# Patient Record
Sex: Female | Born: 1970 | Race: Asian | Hispanic: No | Marital: Married | Smoking: Never smoker
Health system: Southern US, Community
[De-identification: ages and names within clinical notes are randomized; demographics above are authoritative.]

## PROBLEM LIST (undated history)

## (undated) DIAGNOSIS — E079 Disorder of thyroid, unspecified: Secondary | ICD-10-CM

## (undated) DIAGNOSIS — E119 Type 2 diabetes mellitus without complications: Secondary | ICD-10-CM

## (undated) DIAGNOSIS — E785 Hyperlipidemia, unspecified: Secondary | ICD-10-CM

## (undated) HISTORY — PX: THYROIDECTOMY, PARTIAL: SHX18

---

## 2005-02-10 ENCOUNTER — Ambulatory Visit: Payer: Self-pay | Admitting: *Deleted

## 2005-02-12 ENCOUNTER — Ambulatory Visit: Payer: Self-pay | Admitting: *Deleted

## 2005-02-17 ENCOUNTER — Ambulatory Visit: Payer: Self-pay | Admitting: Obstetrics & Gynecology

## 2005-02-17 ENCOUNTER — Ambulatory Visit (HOSPITAL_COMMUNITY): Admission: RE | Admit: 2005-02-17 | Discharge: 2005-02-17 | Payer: Self-pay | Admitting: Obstetrics & Gynecology

## 2005-02-19 ENCOUNTER — Ambulatory Visit: Payer: Self-pay | Admitting: *Deleted

## 2005-02-24 ENCOUNTER — Ambulatory Visit: Payer: Self-pay | Admitting: *Deleted

## 2005-02-26 ENCOUNTER — Ambulatory Visit: Payer: Self-pay | Admitting: *Deleted

## 2005-03-03 ENCOUNTER — Ambulatory Visit: Payer: Self-pay | Admitting: Obstetrics & Gynecology

## 2005-03-05 ENCOUNTER — Ambulatory Visit: Payer: Self-pay | Admitting: *Deleted

## 2005-03-06 ENCOUNTER — Ambulatory Visit: Payer: Self-pay | Admitting: Family Medicine

## 2005-03-06 ENCOUNTER — Inpatient Hospital Stay (HOSPITAL_COMMUNITY): Admission: AD | Admit: 2005-03-06 | Discharge: 2005-03-10 | Payer: Self-pay | Admitting: *Deleted

## 2005-03-07 ENCOUNTER — Encounter (INDEPENDENT_AMBULATORY_CARE_PROVIDER_SITE_OTHER): Payer: Self-pay | Admitting: Specialist

## 2005-04-15 ENCOUNTER — Ambulatory Visit: Payer: Self-pay | Admitting: Family Medicine

## 2006-08-29 IMAGING — US US FETAL BPP W/O NONSTRESS
1 series · 13 of 28 positions shown · non-contrast
Comparison: none

CLINICAL DATA: Patient does not know her LMP and estimates that it is 06/21/04 which would give a gestational age of 34 weeks 3 days.  An outside ultrasound performed on 02/02/05 gives a due date of 03/06/05 which would result in a gestational age today of 37 weeks 4 days.

[Series 1: us fetal bpp w/o nonstress · 0.37mm/px · 71 acquisitions, 13 frames shown]
[im 3/71]
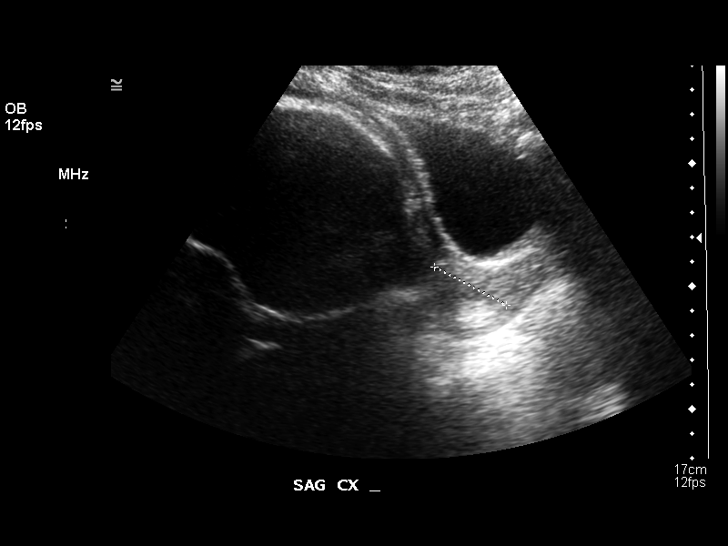
[im 8/71]
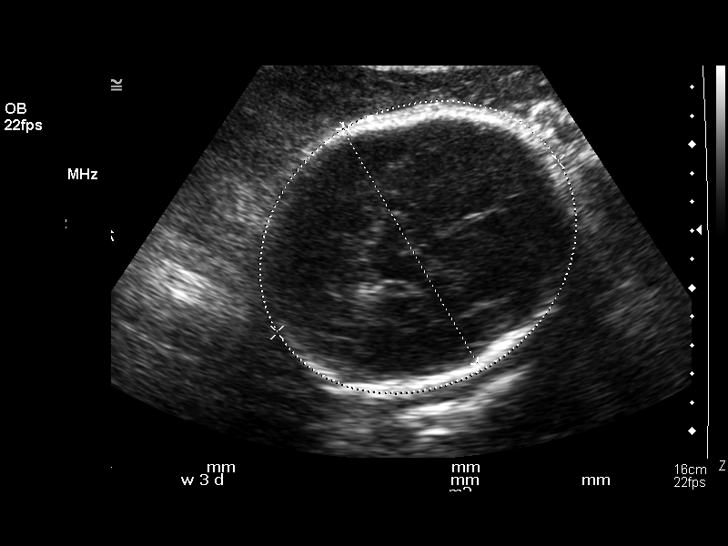
[im 13/71]
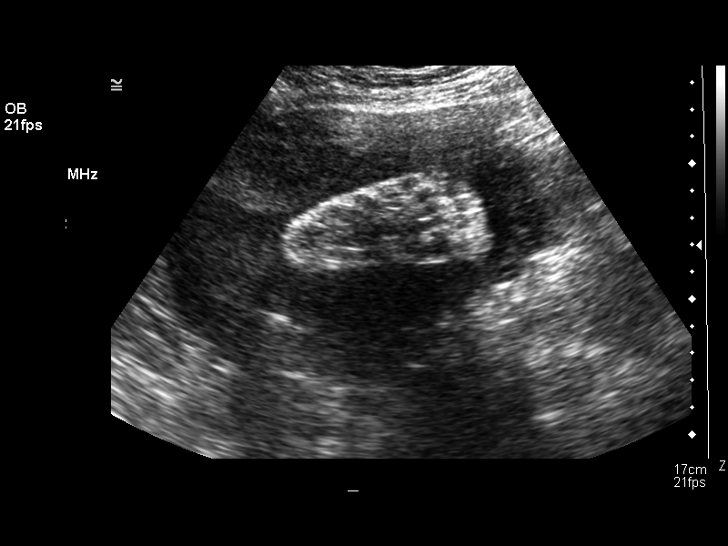
[im 19/71]
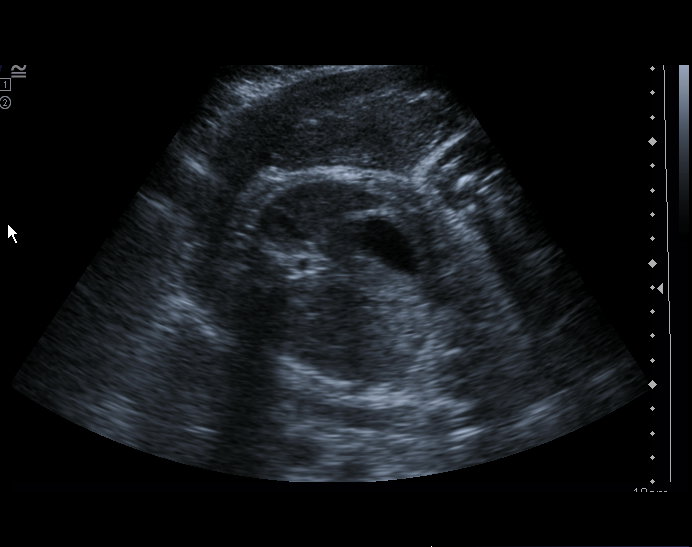
[im 24/71]
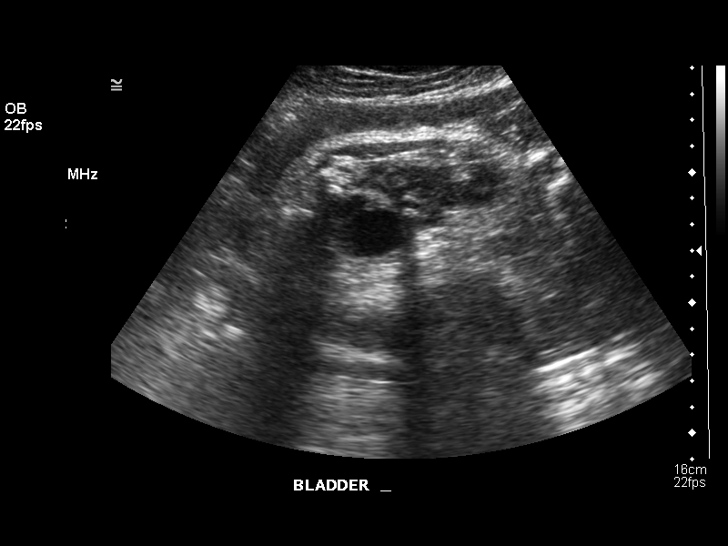
[im 29/71]
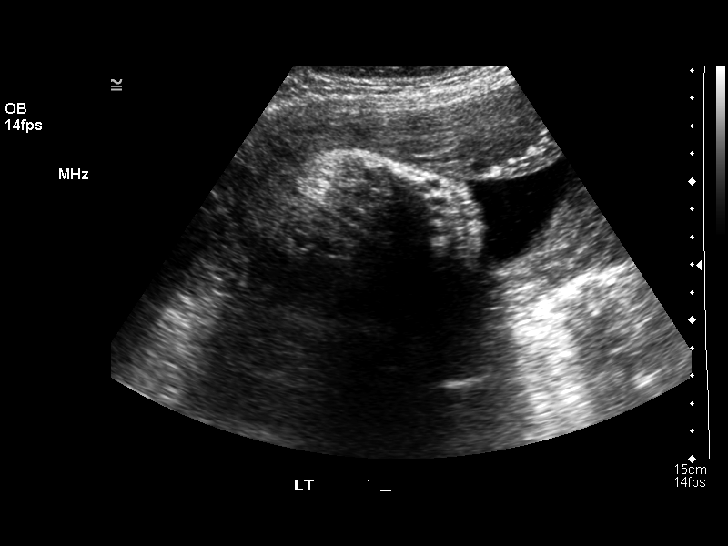
[im 37/71]
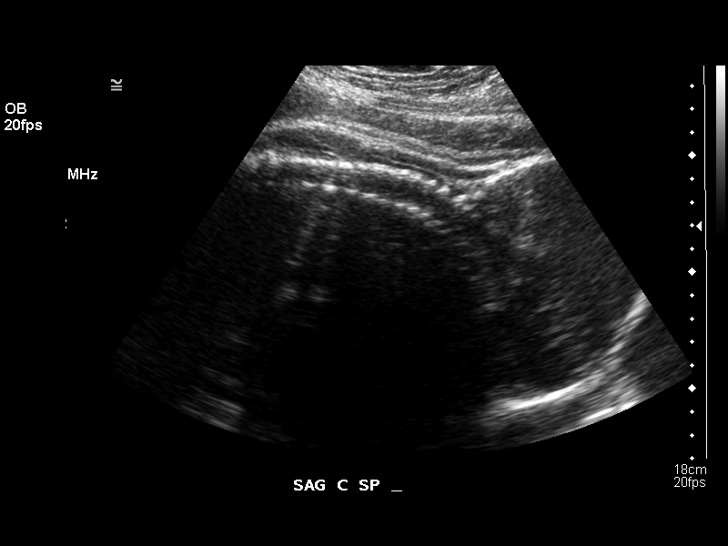
[im 42/71]
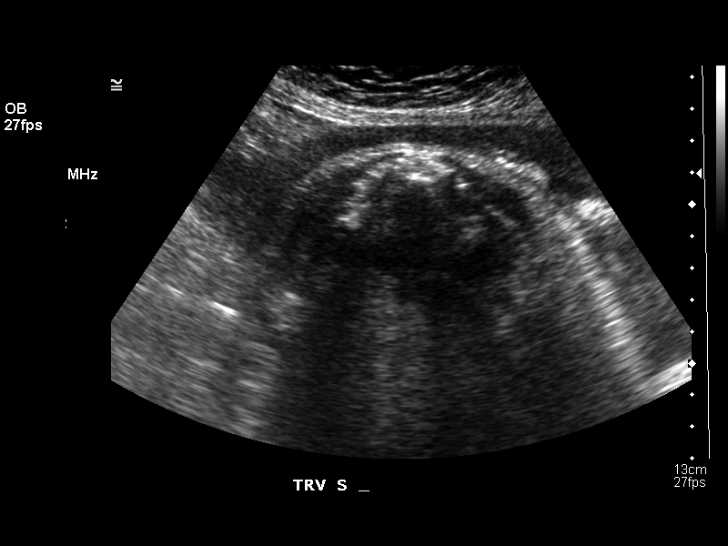
[im 47/71]
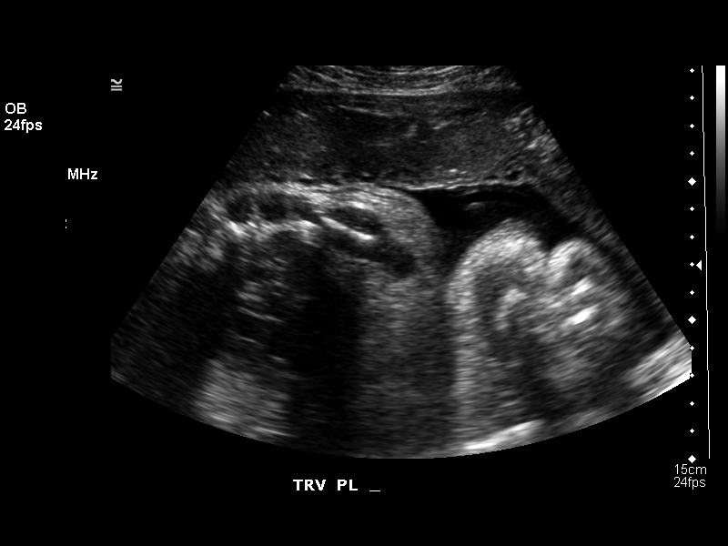
[im 52/71]
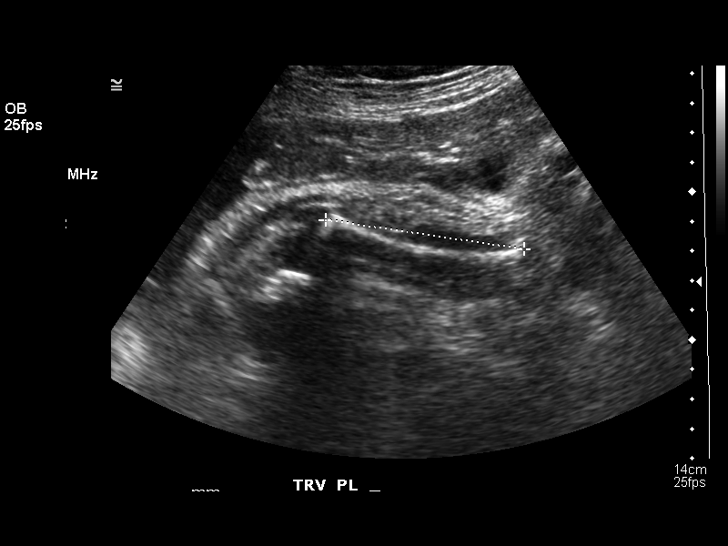
[im 58/71]
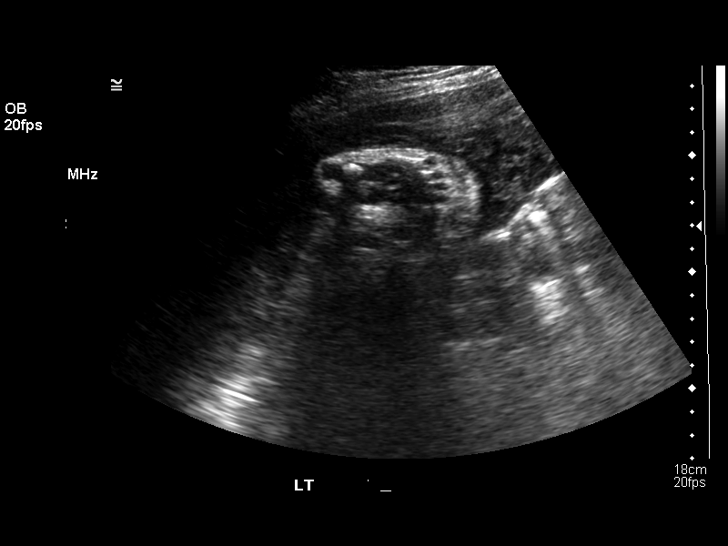
[im 63/71]
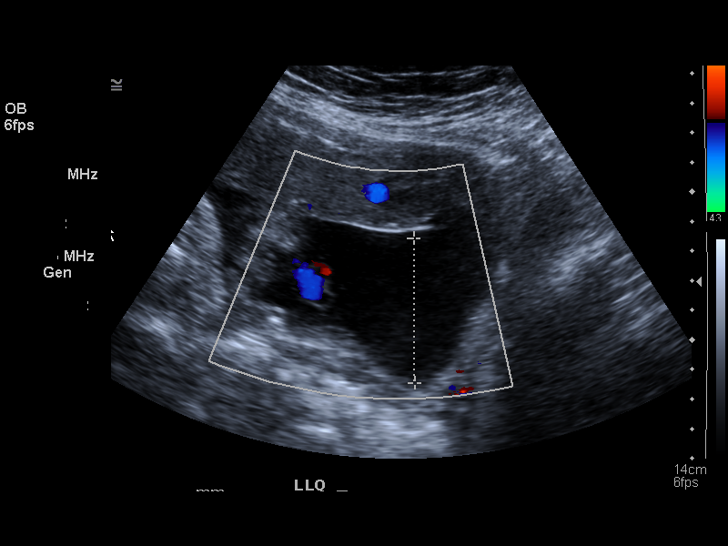
[im 68/71]
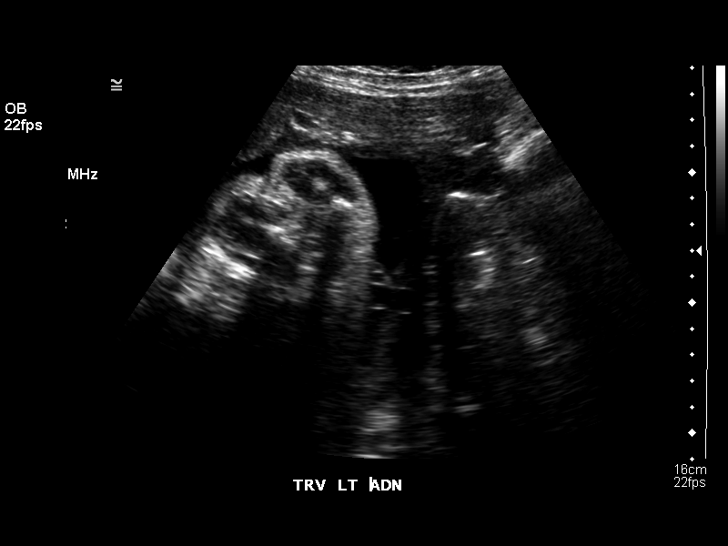

[13 of 28 positions shown; findings below may reference images not displayed]

OBSTETRICAL ULTRASOUND:
Number of Fetuses:  1
Heart Rate:  131
Movement:  Yes
Breathing:  Yes  
Presentation:  Cephalic
Placental Location:  Anterior
Grade:  III
Previa:  No
Amniotic Fluid (Subjective):  Normal
Amniotic Fluid (Objective):  15.1 cm AFI (5th -95th%ile = 7.3 ? 23.9 cm for 38 wks)

FETAL BIOMETRY
BPD:  9.4 cm   38 w 1 d
HC:  33.1 cm  37 w 5 d
AC:  31.8 cm   35 w 5 d
FL:  6.8 cm   34 w 5 d
HL  5.9 cm   34 w 3 d
MEAN GA:  36 w 1 d
?LMP GA:                     34 w 3 d
1st US GA:  37 w 4 d 
EFW:  9181 g (H) 10th ? 25th%ile (0122 ? 8933 g) for 38 wks; 90th ? 95th%Iile (0088 ? 5711 g) if patient is 34 wks

FETAL ANATOMY
Lateral Ventricles:  Visualized 
Thalami/CSP:      Visualized 
Posterior Fossa:  Not visualized 
Nuchal Region:  N/A
Spine:        Visualized except sacral spine
4 Chamber Heart on Left:      Visualized  
Stomach on Left:      Visualized 
3 Vessel Cord:  Visualized 
Cord Insertion site:  Not visualized 
Kidneys:  Visualized 
Bladder:  Visualized 
Extremities:      Left extremities visualized, right extremities not visualized.  

ADDITIONAL ANATOMY VISUALIZED:  LVOT, diaphragm, aortic arch, and male genitalia.

Evaluation limited by:  Maternal habitus, fetal position and advanced gestational age 

MATERNAL UTERINE AND ADNEXAL FINDINGS
Cervix:  3.3 cm Transabdominally
IMPRESSION: 1.  Single living intrauterine fetus in cephalic presentation.  Patient measures 36 weeks 1 day today with a fetal weight of 9181 grams.  This would fall between the 10th and 25th percentile if the patient is felt to be 38 weeks and between the 90th and 95th percentile if the patient is felt to be 34 weeks.
2.  Amniotic fluid volume is normal with AFI 15.1 cm.  
3.  Limited anatomic survey due to late gestational age, maternal habitus and fetal positioning. 

BIOPHYSICAL PROFILE

Movement:  2    Time:  30 minutes
Breathing:  2
Tone:  2
Amniotic Fluid:  2

Total Score:  8
IMPRESSION: Biophysical profile score is [DATE] in 30 minutes of scanning.  

ADDENDUM:
Dr. Santhe Roy that they are using 38 weeks estimated gestational age for dates for this patient.  Given this consideration the femur length would be just above the 5th percentile for a 38 week gestation and the humeral length would be right at the 5th percentile for a 38 week gestation.  This suggests that this is related to physiologic limb shortening rather than a stigmata of rheizomelic shortening.

## 2009-02-20 ENCOUNTER — Ambulatory Visit (HOSPITAL_COMMUNITY): Admission: RE | Admit: 2009-02-20 | Discharge: 2009-02-20 | Payer: Self-pay | Admitting: Family Medicine

## 2010-07-12 ENCOUNTER — Encounter: Payer: Self-pay | Admitting: Obstetrics & Gynecology

## 2010-09-01 IMAGING — US US SOFT TISSUE HEAD/NECK
1 series · 13 of 25 positions shown · non-contrast
Comparison: None

CLINICAL DATA: History given of palpable mass in the right lobe of
the thyroid.

THYROID ULTRASOUND
TECHNIQUE: Ultrasound examination of the thyroid gland and
adjacent soft tissues was performed.

[Series 1: us soft tissue head/neck · 0.11mm/px · 13 of 28 slices shown]
[im 1/28]
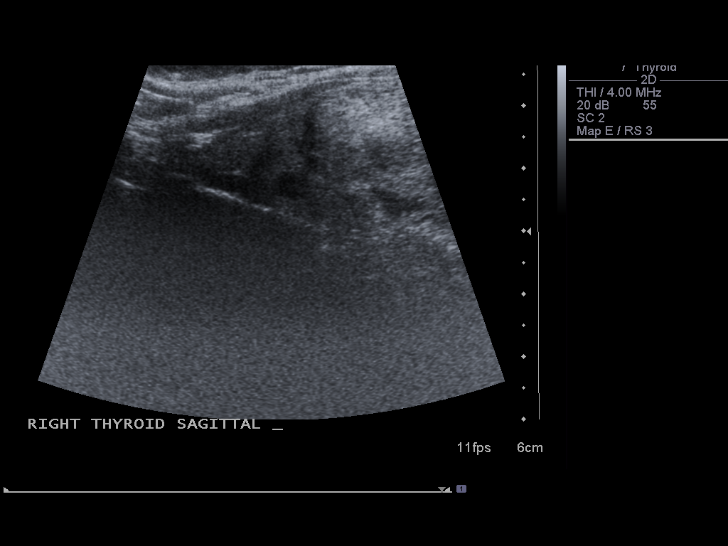
[im 3/28]
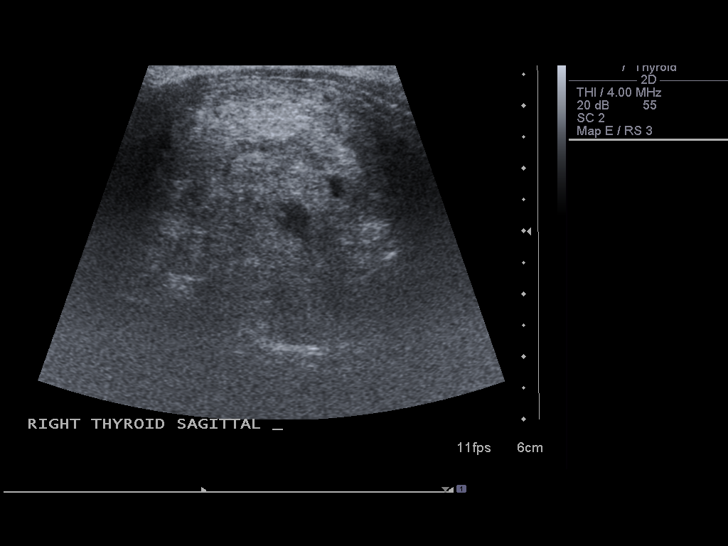
[im 5/28]
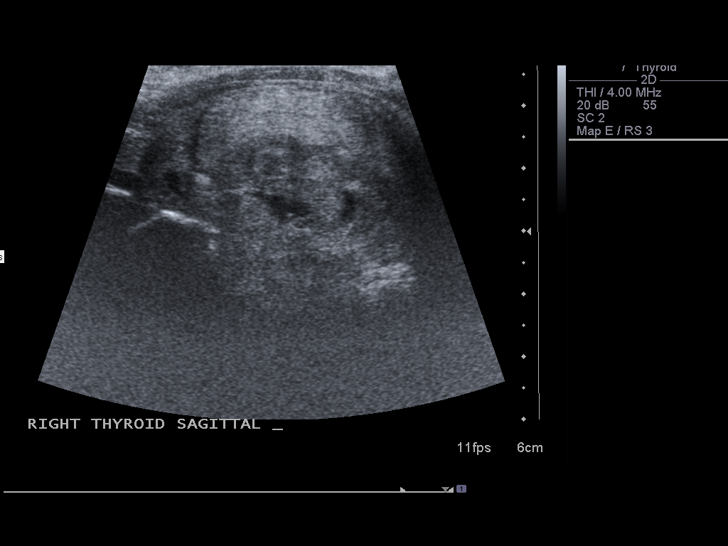
[im 7/28]
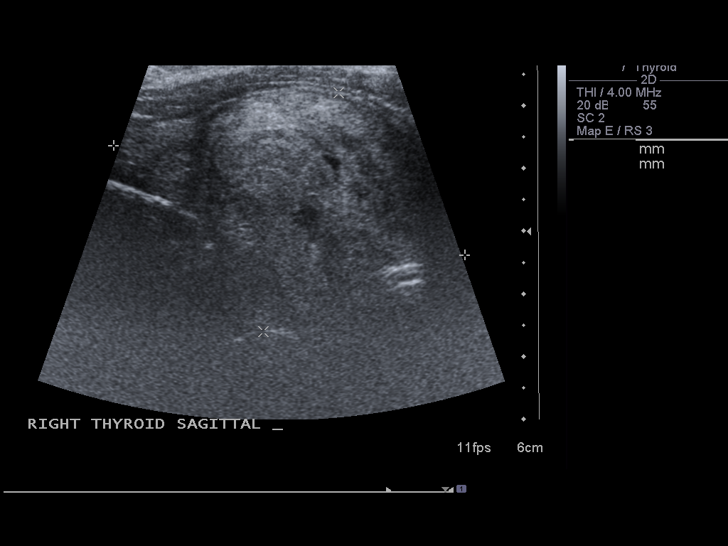
[im 10/28]
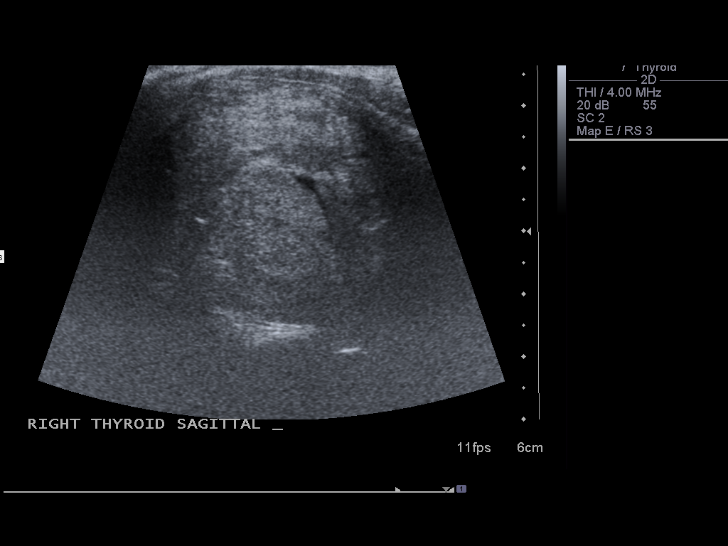
[im 12/28]
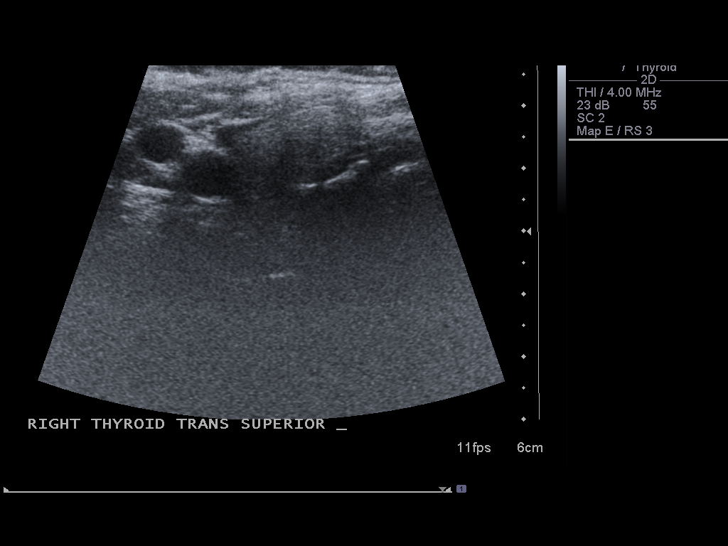
[im 14/28]
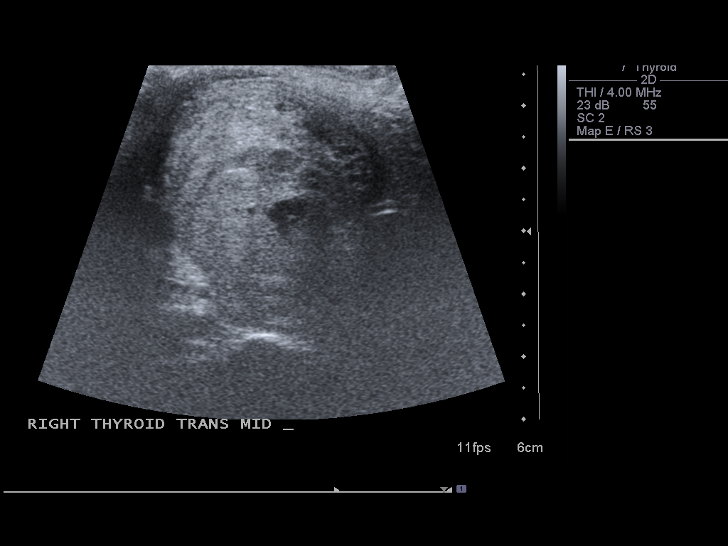
[im 16/28]
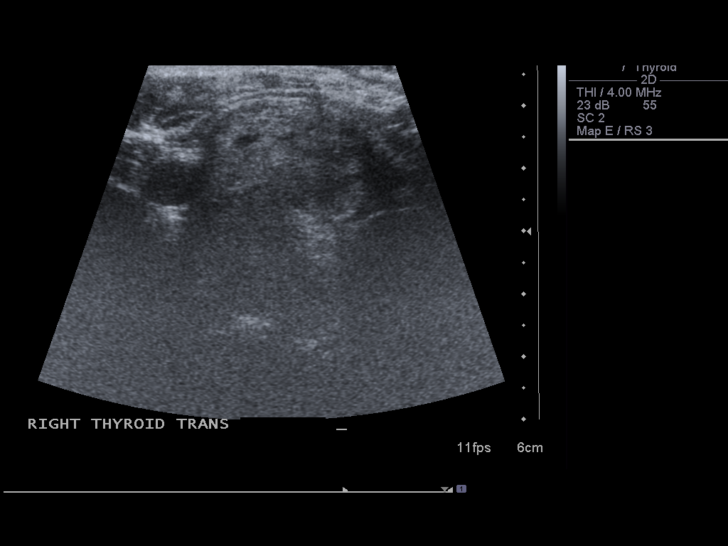
[im 19/28]
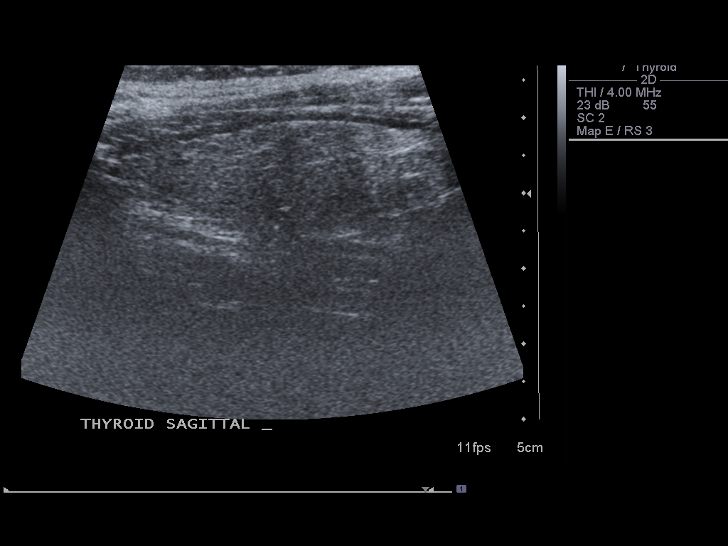
[im 21/28]
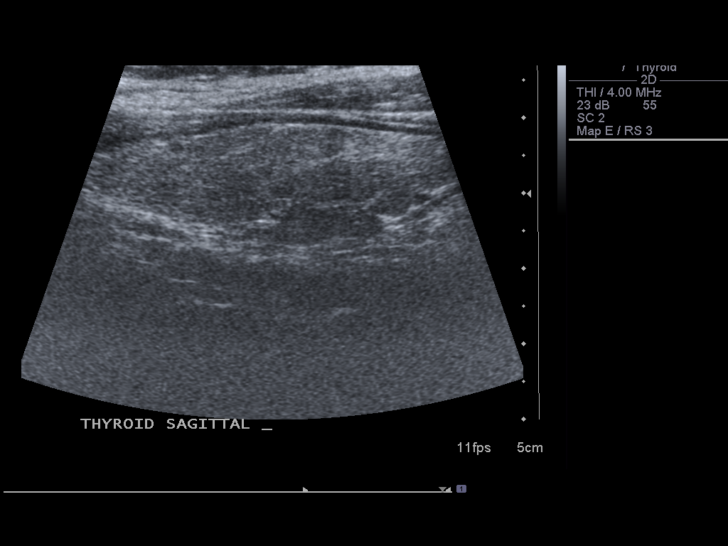
[im 23/28]
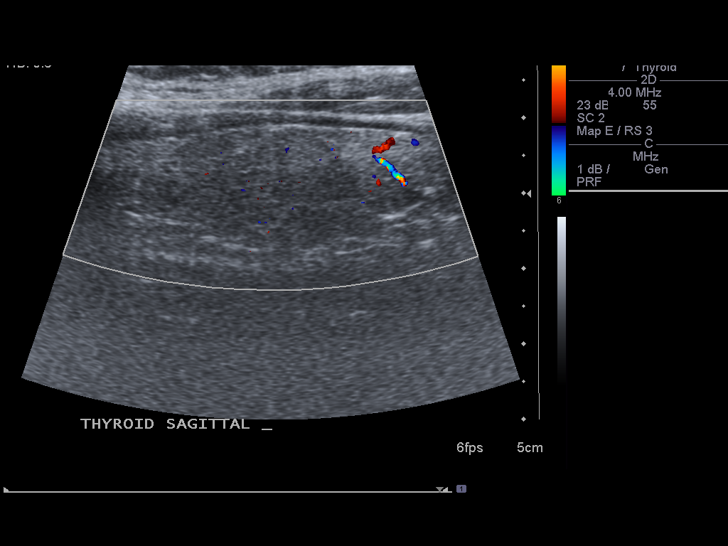
[im 25/28]
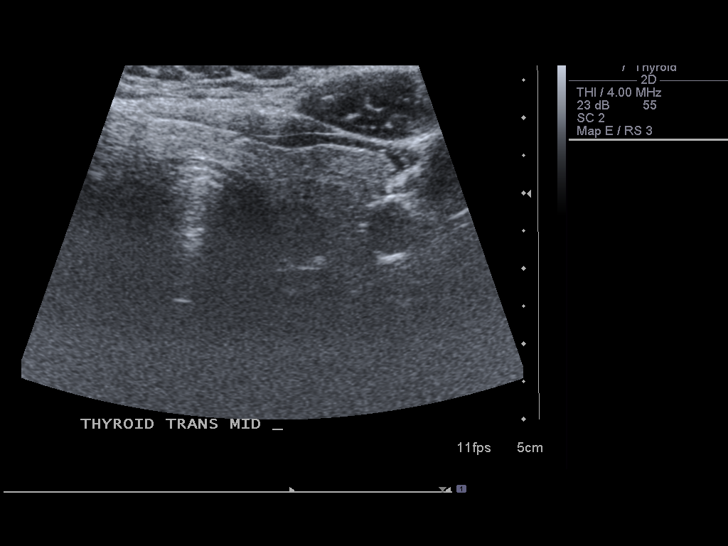
[im 28/28]
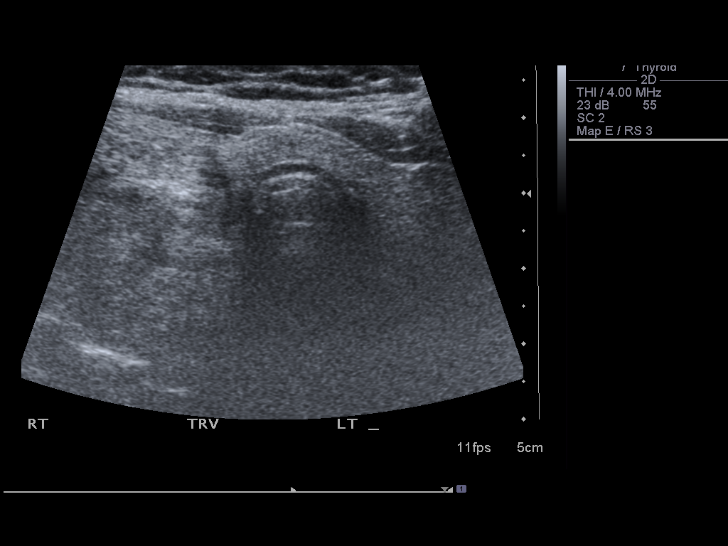

[13 of 25 positions shown; findings below may reference images not displayed]

FINDINGS: The right lobe of the thyroid measures 5.9 x 5.0 x
cm.

The left lobe of the thyroid measures 5.0 x 1.6 x 1.6 cm in
diameter.

The AP diameter of the isthmus is 0.6 cm.

Thyroid parenchymal texture is slightly heterogeneous.  No mass is
evident within the left lobe of the thyroid where the isthmus.

A discrete area of solid heterogeneous nodularity is seen involving
the majority of the right lobe of the thyroid.  This mass measures
3.9 x 3.8 x 4.0 cm in diameter.  No calcifications are seen
associated with it.  There is some internal color Doppler flow.
IMPRESSION: There is mild thyromegaly with the right lobe of the thyroid gland
measuring larger than the left with a solid heterogeneous mass
involving the majority of the right lobe of the thyroid.  It has
some internal color Doppler flow. This could have benign or
malignant etiology.  Recommend ultrasound-guided fine needle
aspiration biopsy of the mass.

This recommendation follows the consensus statement:  "Management
of Thyroid Nodules Detected as US:  Society of Radiologists in
Ultrasound Consensus Conference Statement".  Radiology 1999;
[DATE].  Available online at :
[URL]

## 2010-11-06 NOTE — Discharge Summary (Signed)
NAMEREISHA, WOS           ACCOUNT NO.:  [id number]   MEDICAL RECORD NO.:  [id number]          PATIENT TYPE:  INP   LOCATION:                                [facility]ACILITY:  [facility]   PHYSICIAN:  Tanya S. Shawnie Pons, M.D.   DATE OF BIRTH:  12/21/1970   DATE OF ADMISSION:  03/05/2005  DATE OF DISCHARGE:  03/10/2005                                 DISCHARGE SUMMARY   ADMISSION DIAGNOSES:  83.  A 40 year old G1, P0-0-0 at 40 weeks for induction of labor.  2.  Gestational diabetes A2.  3.  Fetal heart rate reassuring.   DISCHARGE DIAGNOSES:  33.  A 40 year old G1, P1-0-0-1 POD #3 low transverse cesarean section      secondary to failure to progress/descend.  2.  Female, Apgars 9 at the first minute and 9 at the fifth minute.   DISCHARGE MEDICATIONS:  1.  Percocet 625/5 one tablet p.o. q.4-6h. p.r.n. pain.  2.  Ibuprofen 600 mg one tablet p.o. q.6h. p.r.n. pain.  3.  Prenatal vitamins one tablet p.o. daily.   ADMISSION HISTORY:  A 40 year old G1, P0-0-0 at 40 weeks with gestational  diabetes A2 for induction of labor.   HOSPITAL COURSE:  #72 - A 40 year old G1, P0-0-0 at 40 weeks with gestational  diabetes A2 treated with Glyburide came to the MAU for admission to labor  and delivery for induction of labor.  Patient has had late prenatal care  with first ultrasound at 35 weeks.  Induction was initiated with Cervidil  which caused bradycardia secondary to hyperstimulation, therefore, Cervidil  was removed.  When patient was 4 cm Pitocin was started.  The patient had a  very slow progress even though MVUs were adequate.  After approximately four  hours of adequate uterine contractions the patient went from a cervix of 5-6  cm of dilation, 90% of effacement, and high station to 6-7 cm of dilation,  90%, and high station.  The baby was felt to be not completely OA and not  being in the pelvis.  No significant cervical changes were noticed despite  adequate labor documented for three to four hours,  therefore, a cesarean  section was recommended.  Patient tolerated well the procedure.  Amniotic  fluid was clear.  No nuchal cord.  Apgars were 9 at the first minute and 9  at the fifth minute.  For the rest of the hospitalization patient was  clinically stable.  Cesarean section area on physical examination showed no  signs of infection/inflammation.  Pain is controlled with medications.  Staples were removed before discharge home.   #2 - GESTATIONAL DIABETES A2:  During labor patient was on Glyburide 2.5 mg  one tablet p.o. daily.  After labor Glyburide was discontinued.  CBGs  fasting initially 95, 136 at 10:30 a.m., 131 at 1:30 p.m., and 116 at 8 p.m.  Fasting CBGs on day of discharge 92.  Patient was discharged with no  medication for gestational diabetes.  Issue to be followed up at the six-  week appointment.   LABORATORY DATA:  Group O+, antibody negative.  Rubella immune.  GBS  negative.  Hemoglobin 11.3, hematocrit 33.6 both on March 08, 2005.   CONDITION ON DISCHARGE:  Stable.   INSTRUCTIONS:  Pelvic rest for six weeks.  Nothing per vagina, no sex for  six weeks.  Patient understands follow-up in six weeks at [facility] and understands DC meds.   FOLLOW-UP:  At [facility] in six weeks.      Henri Medal, MD    ______________________________  Shelbie Proctor. Shawnie Pons, M.D.    Lendon Colonel  D:  03/10/2005  T:  03/10/2005  Job:  [id number]

## 2010-11-06 NOTE — Op Note (Signed)
[name], [name]           ACCOUNT NO.:  [id number]   MEDICAL RECORD NO.:  [id number]          PATIENT TYPE:  INP   LOCATION:  9106                          [facility]ILITY:  [facility]   PHYSICIAN:  Tanya S. Shawnie Pons, M.D.   DATE OF BIRTH:  11-21-1970   DATE OF PROCEDURE:  03/07/2005  DATE OF DISCHARGE:                                 OPERATIVE REPORT   PREOPERATIVE DIAGNOSES:  1.  Arrest of dilation.  2.  Gestational diabetes mellitus A2.  3.  Late prenatal care.   POSTOPERATIVE DIAGNOSES:  1.  Arrest of dilation.  2.  Gestational diabetes mellitus A2.  3.  Late prenatal care.   PROCEDURE:  Primary low transverse cesarean section.   SURGEON:  Shelbie Proctor. Shawnie Pons, M.D.   ASSISTANT:  None.   ANESTHESIA:  Epidural and local.   SPECIMENS:  Placenta to pathology.   ESTIMATED BLOOD LOSS:  1000 mL.   COMPLICATIONS:  None.   FINDINGS:  A viable female infant, Apgars 9 and 9, weight 6 pounds 15 ounces,  in an oblique position between OA and OT, with the head not being in the  pelvis.   REASON FOR PROCEDURE:  The patient is a 40 year old G1 who is a gestational  diabetic on glyburide, who was being induced for that reason.  She had very  late prenatal care with first ultrasound at 35 weeks, which changed her due  date significantly.  The patient was brought in and induced with Cervidil.  She had a bradycardia related to the Cervidil, which caused  hyperstimulation, and that was removed.  At that time she was 4 cm, and that  was at approximately 10 a.m.  Pitocin was started approximately 11:30 to 12  o'clock and the patient continued to make very slow progress through the  day.  At 4 o'clock she was 5-6, 90, and high.  At 6 o'clock she was 6-7, 90,  and high as well.  With adequate MVUs documented for well over two hours, at  7:25 she continued to be 6-7, 90, and high station.  In fact, the infant was  not even in the pelvis.  The baby was felt to be not completely OA with the  had not  being in the pelvis and no significant cervical change despite  adequate labor documented for three to four hours, so it was felt that the  patient would best be served by abdominal delivery and she agreed to this  plan.   PROCEDURE:  The patient was taken to the operating room, where she was  placed in the supine position with a left lateral tilt.  She was prepped and  draped in the usual sterile fashion and the anesthesia was tested with an  Allis clamp and was found to be adequate.  A Pfannenstiel incision was made  with the knife.  This incision was carried down to the fascia in the  midline.  Any bleeders were cauterized in the subcutaneous tissue and the  fascia opened laterally.  The rectus was then divided and the peritoneal  cavity entered bluntly.  The bladder blade was then  used and __________ and  the low transverse incision was made on the uterus above the bladder.  This  incision was carried down to the amniotic cavity, where clear fluid was  encountered.  The infant's head was brought up and delivered easily.  The  rest of the body followed.  A spontaneous cry on the abdomen, the cord was  clamped x2 and cut while the infant was bulb-suctioned.  The infant was then  handed off to awaiting pediatrics.  Cord blood was obtained, the placenta  delivered easily.  The uterus cleaned with a dry lap pad.  The edges of the  uterine incision were grasped with ring forceps.  The uterus was closed with  0 Vicryl suture in a locked running fashion.  A second imbricating layer was  then used over this, as the patient might choose to labor in a future  pregnancy.  The abdomen was then irrigated, the uterine incision inspected  and found to be hemostatic with the exception of a few bleeders along the  bladder and inferior edge where the bladder flap was.  These were cauterized  with electrocautery.  When hemostasis was adequate, the fascia was closed in  a 0 Vicryl suture in a running  fashion.  The subcutaneous tissue was  irrigated and bleeders cauterized with electrocautery and the skin incision  clipped.  Marcaine 0.25% 25 mL were then injected for added local  anesthesia.  The patient tolerated the procedure well and all instrument,  needle and lap counts were correct x2.  The patient was awakened and taken  to the recovery room in stable condition.  The infant was taken to the  newborn nursery also in stable condition.           ______________________________  Shelbie Proctor Shawnie Pons, M.D.     TSP/MEDQ  D:  03/07/2005  T:  03/08/2005  Job:  [id number]

## 2010-11-06 NOTE — Group Therapy Note (Signed)
[name], MOOR           ACCOUNT NO.:  [id number]   MEDICAL RECORD NO.:  [id number]          PATIENT TYPE:  WOC   LOCATION:  [facility]                   FACILITY:  [facility]   PHYSICIAN:  Tinnie Gens, MD        DATE OF BIRTH:  Feb 15, 1971   DATE OF SERVICE:                                    CLINIC NOTE   CHIEF COMPLAINT:  Followup after C section.   HISTORY OF PRESENT ILLNESS:  The patient is a 40 year old gravida 1, para 1  who delivered by low transverse cesarean delivery __________  who was an A2  diabetic.  The patient has been home for a while.  She still feels some sort  of tearing and sharp pain sensations in the area of her scar.  She is  currently on no birth control.  She is trying to get pregnant again.  The  patient is no longer nursing.  She reports her mood is good.  Her appetite  is always stable.   PHYSICAL EXAMINATION:  VITAL SIGNS:  Her vital signs are as noted on the  chart.  GENERAL:  She is a well-developed, well-nourished, Asian female in no acute  distress.  ABDOMEN:  Soft, nontender, and nondistended.  Her incision is well healed.  The uterus is involuted and small.   IMPRESSION:  Postpartum check.   PLAN:  Pap smear in August of 2007, and return for prenatal care as needed.           ______________________________  Tinnie Gens, MD     TP/MEDQ  D:  04/15/2005  T:  04/15/2005  Job:  [id number]

## 2010-11-06 NOTE — Discharge Summary (Signed)
[name], [name]           ACCOUNT NO.:  [id number]   MEDICAL RECORD NO.:  [id number]          PATIENT TYPE:  INP   LOCATION:                                [facility]   PHYSICIAN:  Tanya S. Shawnie Pons, M.D.   DATE OF BIRTH:  1970/08/09   DATE OF ADMISSION:  03/05/2005  DATE OF DISCHARGE:  03/10/2005                                 DISCHARGE SUMMARY   ADDENDUM:   CONTRACEPTION:  Patient does not desire contraception method.  Patient  states that she has been seeking pregnancy for approximately 16 years.  Patient desires a new pregnancy, therefore, she denies contraception.      Henri Medal, MD    ______________________________  Shelbie Proctor. Shawnie Pons, M.D.    Lendon Colonel  D:  03/10/2005  T:  03/10/2005  Job:  [id number]

## 2014-01-22 ENCOUNTER — Emergency Department (HOSPITAL_BASED_OUTPATIENT_CLINIC_OR_DEPARTMENT_OTHER): Payer: BC Managed Care – PPO

## 2014-01-22 ENCOUNTER — Emergency Department (HOSPITAL_BASED_OUTPATIENT_CLINIC_OR_DEPARTMENT_OTHER)
Admission: EM | Admit: 2014-01-22 | Discharge: 2014-01-22 | Disposition: A | Discharge status: Home / Self Care | Payer: BC Managed Care – PPO | Attending: Emergency Medicine | Admitting: Emergency Medicine

## 2014-01-22 ENCOUNTER — Encounter (HOSPITAL_BASED_OUTPATIENT_CLINIC_OR_DEPARTMENT_OTHER): Payer: Self-pay | Admitting: Emergency Medicine

## 2014-01-22 DIAGNOSIS — R1011 Right upper quadrant pain: Secondary | ICD-10-CM | POA: Insufficient documentation

## 2014-01-22 DIAGNOSIS — Z90710 Acquired absence of both cervix and uterus: Secondary | ICD-10-CM | POA: Insufficient documentation

## 2014-01-22 DIAGNOSIS — Z3202 Encounter for pregnancy test, result negative: Secondary | ICD-10-CM | POA: Insufficient documentation

## 2014-01-22 DIAGNOSIS — Z79899 Other long term (current) drug therapy: Secondary | ICD-10-CM | POA: Insufficient documentation

## 2014-01-22 DIAGNOSIS — E119 Type 2 diabetes mellitus without complications: Secondary | ICD-10-CM | POA: Insufficient documentation

## 2014-01-22 DIAGNOSIS — K8020 Calculus of gallbladder without cholecystitis without obstruction: Secondary | ICD-10-CM | POA: Insufficient documentation

## 2014-01-22 DIAGNOSIS — Z862 Personal history of diseases of the blood and blood-forming organs and certain disorders involving the immune mechanism: Secondary | ICD-10-CM | POA: Insufficient documentation

## 2014-01-22 DIAGNOSIS — Z8639 Personal history of other endocrine, nutritional and metabolic disease: Secondary | ICD-10-CM | POA: Insufficient documentation

## 2014-01-22 DIAGNOSIS — E785 Hyperlipidemia, unspecified: Secondary | ICD-10-CM | POA: Insufficient documentation

## 2014-01-22 HISTORY — DX: Hyperlipidemia, unspecified: E78.5

## 2014-01-22 HISTORY — DX: Type 2 diabetes mellitus without complications: E11.9

## 2014-01-22 HISTORY — DX: Disorder of thyroid, unspecified: E07.9

## 2014-01-22 LAB — URINALYSIS, ROUTINE W REFLEX MICROSCOPIC
BILIRUBIN URINE: NEGATIVE
Glucose, UA: NEGATIVE mg/dL
HGB URINE DIPSTICK: NEGATIVE
Ketones, ur: NEGATIVE mg/dL
Leukocytes, UA: NEGATIVE
NITRITE: NEGATIVE
PH: 5.5 (ref 5.0–8.0)
PROTEIN: NEGATIVE mg/dL
SPECIFIC GRAVITY, URINE: 1.005 (ref 1.005–1.030)
Urobilinogen, UA: 0.2 mg/dL (ref 0.0–1.0)

## 2014-01-22 LAB — COMPREHENSIVE METABOLIC PANEL
ALBUMIN: 4.3 g/dL (ref 3.5–5.2)
ALK PHOS: 59 U/L (ref 39–117)
ALT: 8 U/L (ref 0–35)
ANION GAP: 15 (ref 5–15)
AST: 15 U/L (ref 0–37)
BILIRUBIN TOTAL: 0.4 mg/dL (ref 0.3–1.2)
BUN: 10 mg/dL (ref 6–23)
CO2: 26 mEq/L (ref 19–32)
CREATININE: 0.9 mg/dL (ref 0.50–1.10)
Calcium: 9.6 mg/dL (ref 8.4–10.5)
Chloride: 99 mEq/L (ref 96–112)
GFR, EST AFRICAN AMERICAN: 89 mL/min — AB (ref >90)
GFR, EST NON AFRICAN AMERICAN: 77 mL/min — AB (ref >90)
GLUCOSE: 133 mg/dL — AB (ref 70–99)
POTASSIUM: 4.3 meq/L (ref 3.7–5.3)
Sodium: 140 mEq/L (ref 137–147)
Total Protein: 8 g/dL (ref 6.0–8.3)

## 2014-01-22 LAB — LIPASE, BLOOD: LIPASE: 22 U/L (ref 11–59)

## 2014-01-22 LAB — PREGNANCY, URINE: PREG TEST UR: NEGATIVE

## 2014-01-22 LAB — CBC WITH DIFFERENTIAL/PLATELET
BASOS ABS: 0 10*3/uL (ref 0.0–0.1)
BASOS PCT: 0 % (ref 0–1)
EOS ABS: 0.3 10*3/uL (ref 0.0–0.7)
EOS PCT: 4 % (ref 0–5)
HEMATOCRIT: 33.2 % — AB (ref 36.0–46.0)
HEMOGLOBIN: 11.2 g/dL — AB (ref 12.0–15.0)
LYMPHS PCT: 24 % (ref 12–46)
Lymphs Abs: 1.7 10*3/uL (ref 0.7–4.0)
MCH: 25.9 pg — AB (ref 26.0–34.0)
MCHC: 33.7 g/dL (ref 30.0–36.0)
MCV: 76.9 fL — ABNORMAL LOW (ref 78.0–100.0)
MONO ABS: 0.5 10*3/uL (ref 0.1–1.0)
MONOS PCT: 7 % (ref 3–12)
Neutro Abs: 4.5 10*3/uL (ref 1.7–7.7)
Neutrophils Relative %: 64 % (ref 43–77)
PLATELETS: 322 10*3/uL (ref 150–400)
RBC: 4.32 MIL/uL (ref 3.87–5.11)
RDW: 12.3 % (ref 11.5–15.5)
WBC: 7.1 10*3/uL (ref 4.0–10.5)

## 2014-01-22 MED ORDER — HYDROCODONE-ACETAMINOPHEN 5-325 MG PO TABS: 1.0000 | ORAL_TABLET | ORAL | Status: AC | PRN

## 2014-01-22 NOTE — ED Provider Notes (Signed)
CSN: [id number]     Arrival date & time 01/22/14  1038 History   First MD Initiated Contact with Patient 01/22/14 1153     Chief Complaint  Patient presents with  . Abdominal Pain     (Consider location/radiation/quality/duration/timing/severity/associated sxs/prior Treatment) HPI Comments: Patient is a 43 year old female with history of type 2 diabetes and hysterectomy. She presents today with complaints of abdominal cramping and distention that started last night. She states she feels as though she does have a bowel movement and passed gas but is unable to do so. She denies any difficulty urinating. She denies any fevers or chills. She states that her symptoms are not worse with eating.  Patient is a 43 y.o. female presenting with abdominal pain. The history is provided by the patient.  Abdominal Pain Pain location:  RUQ and epigastric Pain quality: cramping   Pain radiates to:  Does not radiate Pain severity:  Moderate Onset quality:  Gradual Duration:  2 days Timing:  Intermittent Progression:  Worsening Chronicity:  New Relieved by:  Nothing Worsened by:  Nothing tried Ineffective treatments:  None tried   Past Medical History  Diagnosis Date  . Thyroid disease   . Diabetes mellitus without complication   . Hyperlipidemia    Past Surgical History  Procedure Laterality Date  . Thyroidectomy, partial     No family history on file. History  Substance Use Topics  . Smoking status: Never Smoker   . Smokeless tobacco: Not on file  . Alcohol Use: No   OB History   Grav Para Term Preterm Abortions TAB SAB Ect Mult Living                 Review of Systems  Gastrointestinal: Positive for abdominal pain.  All other systems reviewed and are negative.     Allergies  Review of patient's allergies indicates no known allergies.  Home Medications   Prior to Admission medications   Medication Sig Start Date End Date Taking? Authorizing Provider  fluvastatin (LESCOL) 20  MG capsule Take 20 mg by mouth at bedtime.   Yes Historical Provider, MD  metFORMIN (GLUCOPHAGE) 1000 MG tablet Take 1,000 mg by mouth 2 (two) times daily with a meal.   Yes Historical Provider, MD   BP 136/93  Pulse 91  Temp(Src) 98.7 F (37.1 C) (Oral)  Resp 16  Ht 4\' 11"  (1.499 m)  Wt 153 lb (69.4 kg)  BMI 30.89 kg/m2  SpO2 100%  LMP 01/15/2014 Physical Exam  Nursing note and vitals reviewed. Constitutional: She is oriented to person, place, and time. She appears well-developed and well-nourished. No distress.  HENT:  Head: Normocephalic and atraumatic.  Neck: Normal range of motion. Neck supple.  Cardiovascular: Normal rate and regular rhythm.  Exam reveals no gallop and no friction rub.   No murmur heard. Pulmonary/Chest: Effort normal and breath sounds normal. No respiratory distress. She has no wheezes.  Abdominal: Soft. Bowel sounds are normal. She exhibits no distension. There is tenderness.  There is mild tenderness to palpation in the epigastrium and right upper quadrant. There is a rebound and no guarding.  Musculoskeletal: Normal range of motion.  Neurological: She is alert and oriented to person, place, and time.  Skin: Skin is warm and dry. She is not diaphoretic.    ED Course  Procedures (including critical care time) Labs Review Labs Reviewed  PREGNANCY, URINE  URINALYSIS, ROUTINE W REFLEX MICROSCOPIC  CBC WITH DIFFERENTIAL  COMPREHENSIVE METABOLIC PANEL  LIPASE, BLOOD  Imaging Review No results found.   EKG Interpretation None      MDM   Final diagnoses:  None    Workup reveals gallstones on abdominal ultrasound with no evidence of acute cholecystitis. There is mild thickening of the gallbladder wall, however no pericholecystic fluid. There is no fever or white count or elevation of LFTs. She is feeling better. I feel as though she is appropriate for discharge with pain medication and followup with Gen. surgery.    Geoffery Lyonsouglas Gabino Hagin,  MD 01/22/14 [phone]

## 2014-01-22 NOTE — Discharge Instructions (Signed)
Hydrocodone as prescribed as needed for pain.  Followup with [facility]. The contact information has been provided in this discharge summary.  Return to the ER if you develop worsening pain, high fever, bloody stool, or other new and concerning symptoms.   Abdominal Pain Many things can cause abdominal pain. Usually, abdominal pain is not caused by a disease and will improve without treatment. It can often be observed and treated at home. Your health care provider will do a physical exam and possibly order blood tests and X-rays to help determine the seriousness of your pain. However, in many cases, more time must pass before a clear cause of the pain can be found. Before that point, your health care provider may not know if you need more testing or further treatment. HOME CARE INSTRUCTIONS  Monitor your abdominal pain for any changes. The following actions may help to alleviate any discomfort you are experiencing:  Only take over-the-counter or prescription medicines as directed by your health care provider.  Do not take laxatives unless directed to do so by your health care provider.  Try a clear liquid diet (broth, tea, or water) as directed by your health care provider. Slowly move to a bland diet as tolerated. SEEK MEDICAL CARE IF:  You have unexplained abdominal pain.  You have abdominal pain associated with nausea or diarrhea.  You have pain when you urinate or have a bowel movement.  You experience abdominal pain that wakes you in the night.  You have abdominal pain that is worsened or improved by eating food.  You have abdominal pain that is worsened with eating fatty foods.  You have a fever. SEEK IMMEDIATE MEDICAL CARE IF:   Your pain does not go away within 2 hours.  You keep throwing up (vomiting).  Your pain is felt only in portions of the abdomen, such as the right side or the left lower portion of the abdomen.  You pass bloody or black tarry  stools. MAKE SURE YOU:  Understand these instructions.   Will watch your condition.   Will get help right away if you are not doing well or get worse.  Document Released: 03/17/2005 Document Revised: 06/12/2013 Document Reviewed: 02/14/2013 [facility] Patient Information 2015 [place], [place]. This information is not intended to replace advice given to you by your health care provider. Make sure you discuss any questions you have with your health care provider.  Biliary Colic  Biliary colic is a steady or irregular pain in the upper abdomen. It is usually under the right side of the rib cage. It happens when gallstones interfere with the normal flow of bile from the gallbladder. Bile is a liquid that helps to digest fats. Bile is made in the liver and stored in the gallbladder. When you eat a meal, bile passes from the gallbladder through the cystic duct and the common bile duct into the small intestine. There, it mixes with partially digested food. If a gallstone blocks either of these ducts, the normal flow of bile is blocked. The muscle cells in the bile duct contract forcefully to try to move the stone. This causes the pain of biliary colic.  SYMPTOMS   A person with biliary colic usually complains of pain in the upper abdomen. This pain can be:  In the center of the upper abdomen just below the breastbone.  In the upper-right part of the abdomen, near the gallbladder and liver.  Spread back toward the right shoulder blade.  Nausea and  vomiting.  The pain usually occurs after eating.  Biliary colic is usually triggered by the digestive system's demand for bile. The demand for bile is high after fatty meals. Symptoms can also occur when a person who has been fasting suddenly eats a very large meal. Most episodes of biliary colic pass after 1 to 5 hours. After the most intense pain passes, your abdomen may continue to ache mildly for about 24 hours. DIAGNOSIS  After you describe your  symptoms, your caregiver will perform a physical exam. He or she will pay attention to the upper right portion of your belly (abdomen). This is the area of your liver and gallbladder. An ultrasound will help your caregiver look for gallstones. Specialized scans of the gallbladder may also be done. Blood tests may be done, especially if you have fever or if your pain persists. PREVENTION  Biliary colic can be prevented by controlling the risk factors for gallstones. Some of these risk factors, such as heredity, increasing age, and pregnancy are a normal part of life. Obesity and a high-fat diet are risk factors you can change through a healthy lifestyle. Women going through menopause who take hormone replacement therapy (estrogen) are also more likely to develop biliary colic. TREATMENT   Pain medication may be prescribed.  You may be encouraged to eat a fat-free diet.  If the first episode of biliary colic is severe, or episodes of colic keep retuning, surgery to remove the gallbladder (cholecystectomy) is usually recommended. This procedure can be done through small incisions using an instrument called a laparoscope. The procedure often requires a brief stay in the hospital. Some people can leave the hospital the same day. It is the most widely used treatment in people troubled by painful gallstones. It is effective and safe, with no complications in more than 90% of cases.  If surgery cannot be done, medication that dissolves gallstones may be used. This medication is expensive and can take months or years to work. Only small stones will dissolve.  Rarely, medication to dissolve gallstones is combined with a procedure called shock-wave lithotripsy. This procedure uses carefully aimed shock waves to break up gallstones. In many people treated with this procedure, gallstones form again within a few years. PROGNOSIS  If gallstones block your cystic duct or common bile duct, you are at risk for repeated  episodes of biliary colic. There is also a 25% chance that you will develop a gallbladder infection(acute cholecystitis), or some other complication of gallstones within 10 to 20 years. If you have surgery, schedule it at a time that is convenient for you and at a time when you are not sick. HOME CARE INSTRUCTIONS   Drink plenty of clear fluids.  Avoid fatty, greasy or fried foods, or any foods that make your pain worse.  Take medications as directed. SEEK MEDICAL CARE IF:   You develop a fever over 100.5 F (38.1 C).  Your pain gets worse over time.  You develop nausea that prevents you from eating and drinking.  You develop vomiting. SEEK IMMEDIATE MEDICAL CARE IF:   You have continuous or severe belly (abdominal) pain which is not relieved with medications.  You develop nausea and vomiting which is not relieved with medications.  You have symptoms of biliary colic and you suddenly develop a fever and shaking chills. This may signal cholecystitis. Call your caregiver immediately.  You develop a yellow color to your skin or the white part of your eyes (jaundice). Document Released: 11/08/2005 Document  Revised: 08/30/2011 Document Reviewed: 01/18/2008 [employer] Patient Information 2015 [place], [place]. This information is not intended to replace advice given to you by your health care provider. Make sure you discuss any questions you have with your health care provider.

## 2014-01-22 NOTE — ED Notes (Signed)
Abdominal pain that started last night.  Nausea, sweats, and feels like she needs to have a BM.

## 2015-08-03 IMAGING — US US ABDOMEN COMPLETE
1 series · 13 of 25 positions shown · non-contrast
Comparison: None.

CLINICAL DATA: Abdominal pain and nausea.

EXAM:
ULTRASOUND ABDOMEN COMPLETE

[Series 1: us abdomen complete · 0.35mm/px · 13 of 66 slices shown]
[im 1/66]
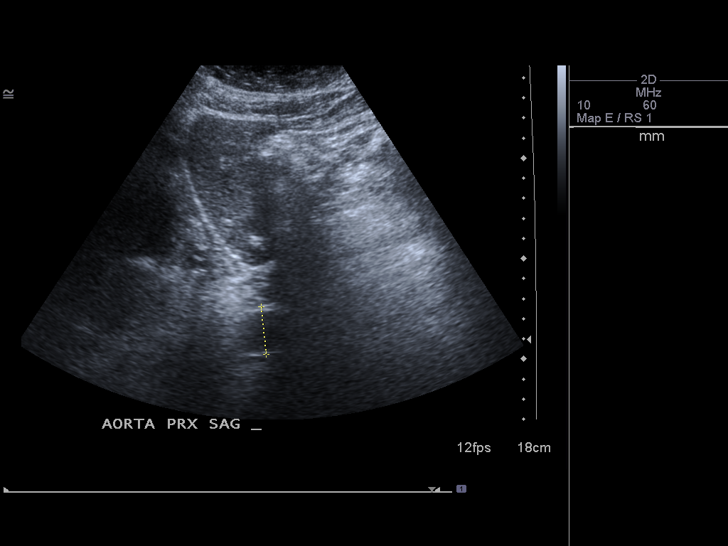
[im 6/66]
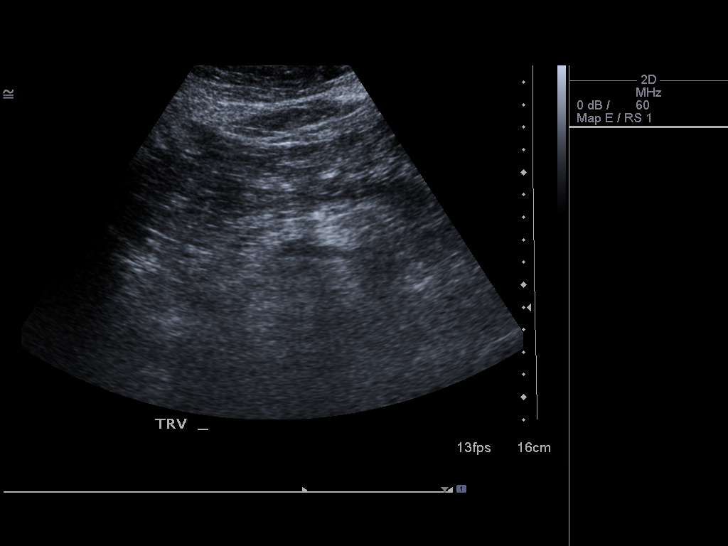
[im 11/66]
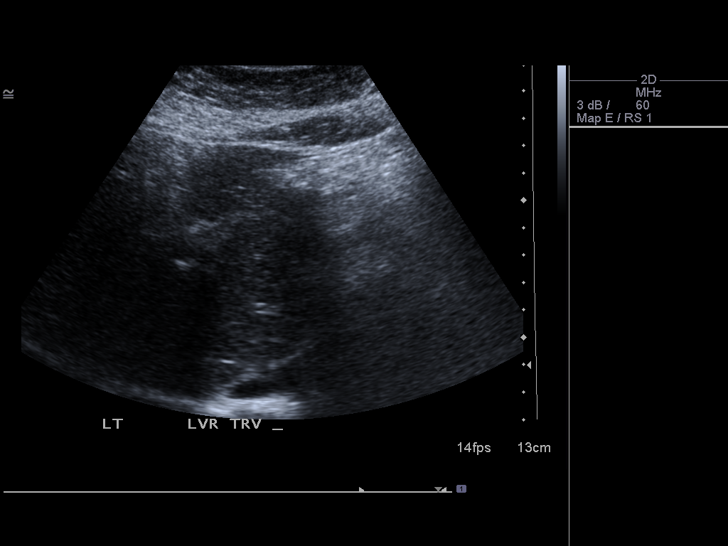
[im 17/66]
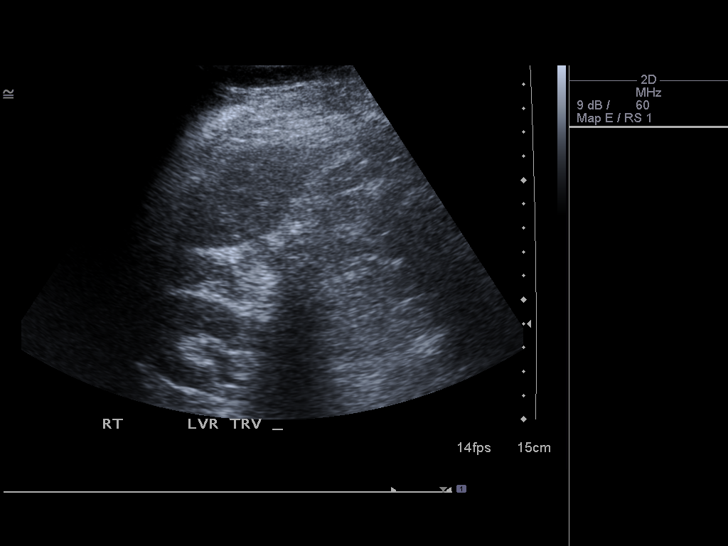
[im 22/66]
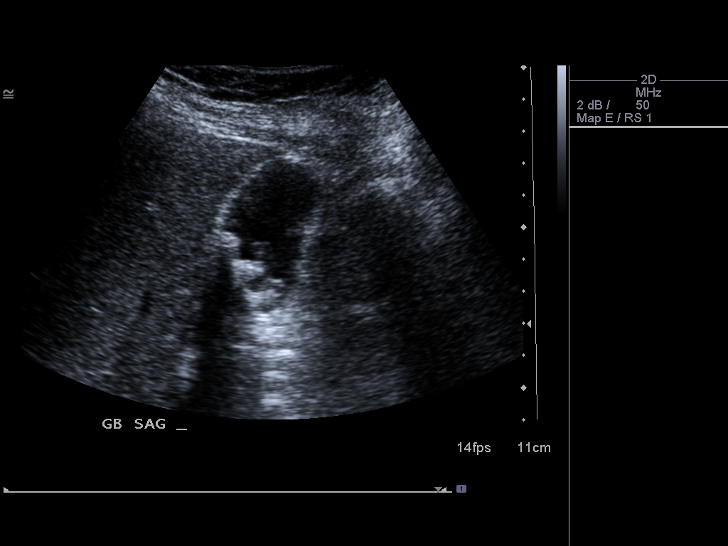
[im 28/66]
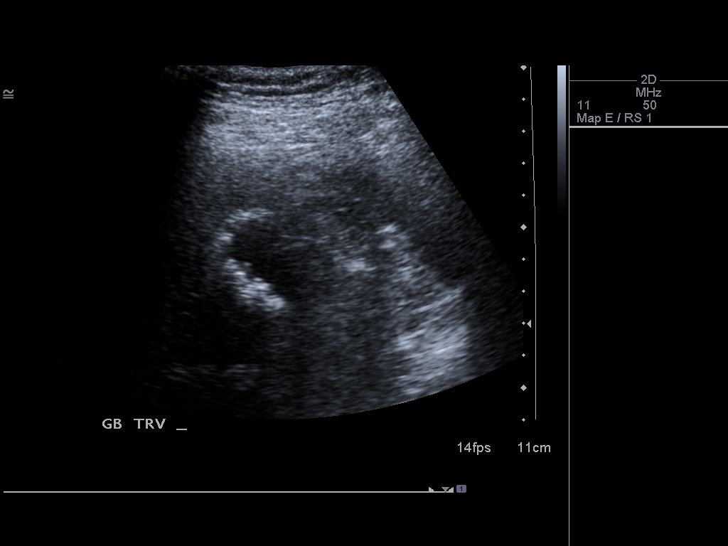
[im 33/66]
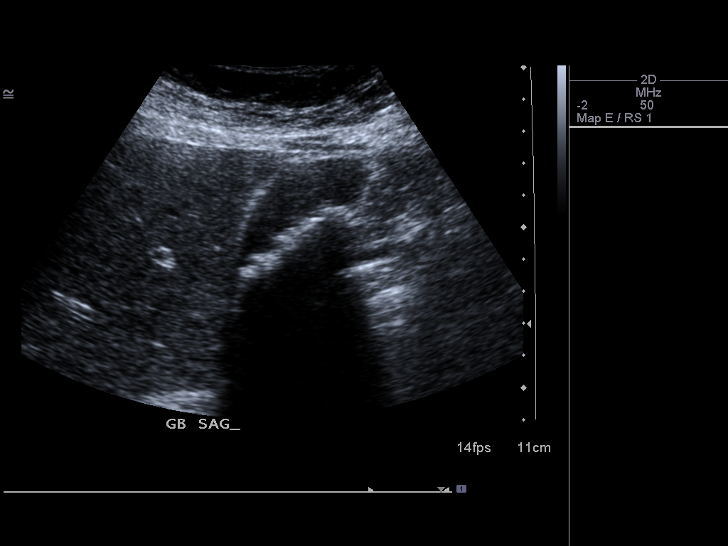
[im 38/66]
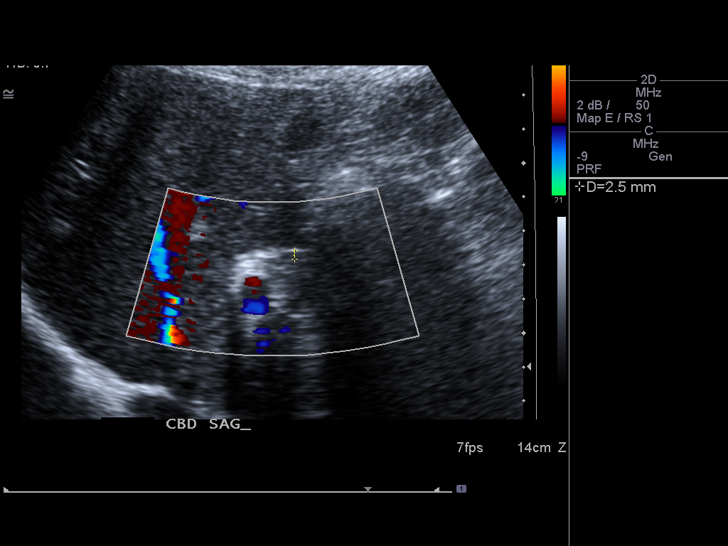
[im 44/66]
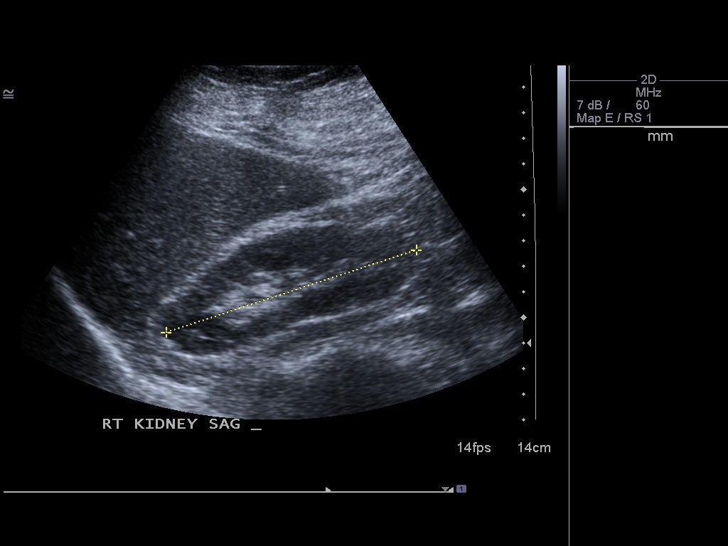
[im 49/66]
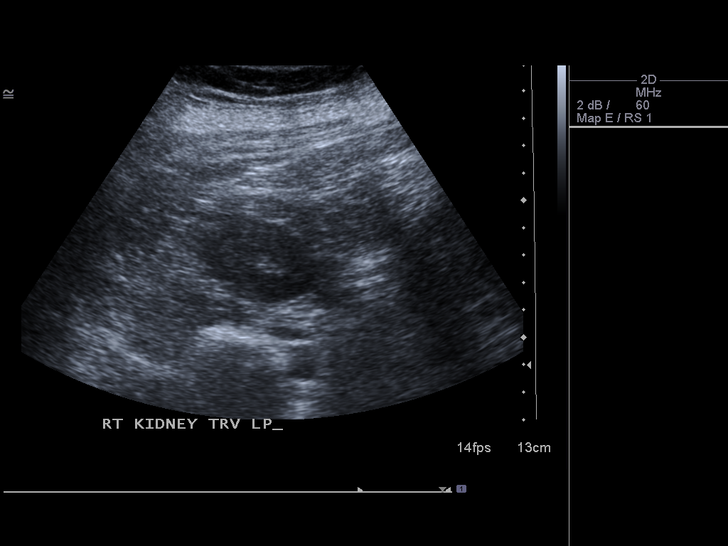
[im 55/66]
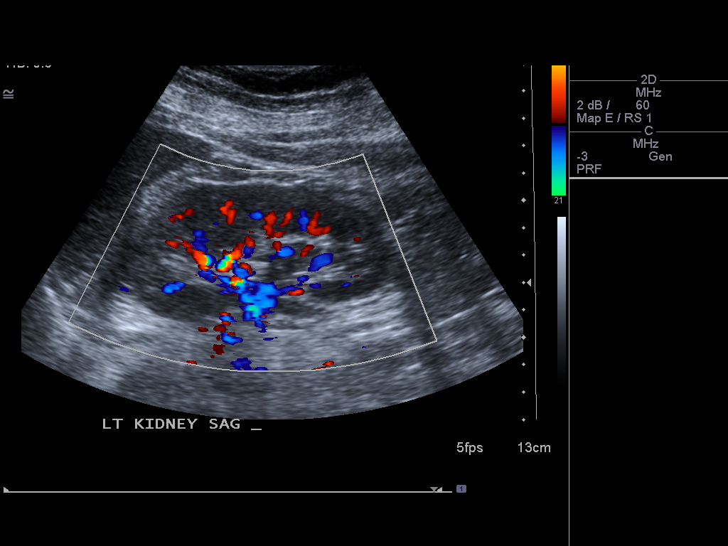
[im 60/66]
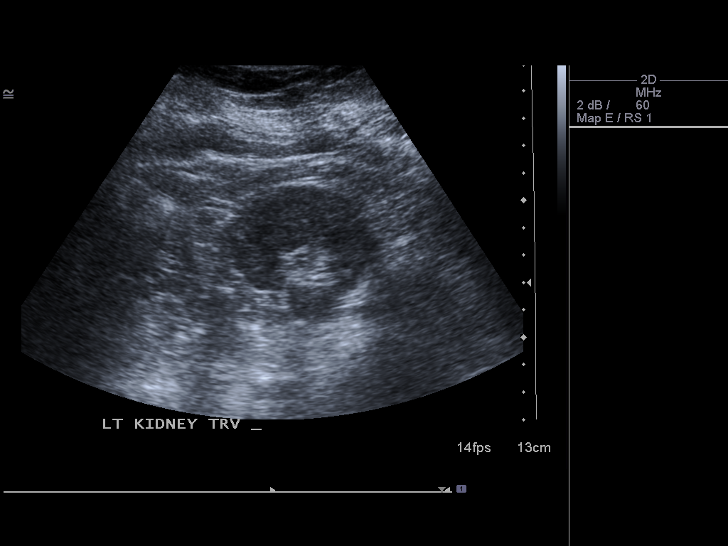
[im 66/66]
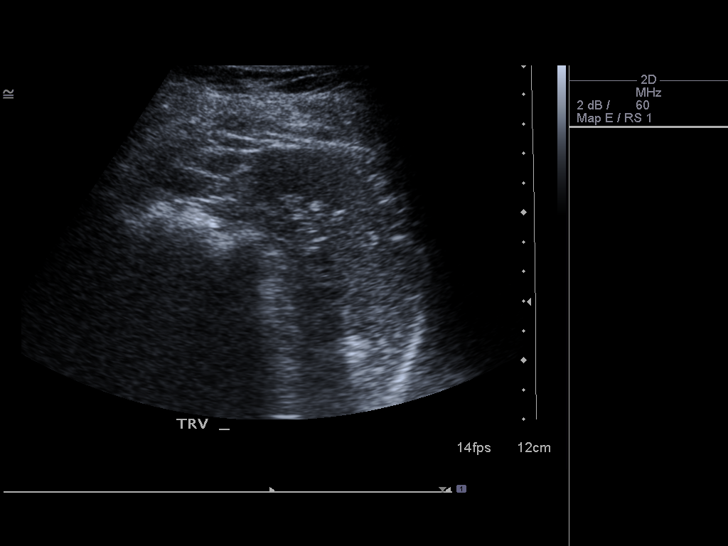

[13 of 25 positions shown; findings below may reference images not displayed]

FINDINGS: Gallbladder:

Multiple shadowing mobile gallstones are present. The wall thickness
is upper limits of normal at 3 mm. There is no sonographic Murphy
sign.

Common bile duct:

Diameter: 3 mm, within normal limits

Liver:

No focal lesion identified. Within normal limits in parenchymal
echogenicity.

IVC:

The IVC is poorly seen due to overlying bowel gas.

Pancreas:

The pancreas is poorly seen due to overlying bowel gas.

Spleen:

Spleen is of normal size and echotexture measuring 6.2 cm maximally,
within normal limits.

Right Kidney:

Length: 10.3 cm, within normal limits. Echogenicity within normal
limits. No mass or hydronephrosis visualized.

Left Kidney:

Length: 9.7 cm, within normal limits. Echogenicity within normal
limits. No mass or hydronephrosis visualized.

Abdominal aorta:

The proximal and distal aorta are seen. The midportion of the aorta
is not well visualized. The maximal diameter is 2.4 cm.

Other findings:

None.
IMPRESSION: 1. Cholelithiasis without definite cholecystitis. Wall thickening is
near the upper limits of normal but there is no sonographic Murphy
sign.
2. The visualized solid organs are otherwise normal.
3. Poor visualization of the IVC and pancreas due to overlying bowel
gas.

## 2015-08-03 IMAGING — CR DG ABDOMEN 1V
1 series · 1 of 1 positions shown · non-contrast
Comparison: Abdominal ultrasound January 22, 2014

CLINICAL DATA: Abdominal pain.

EXAM:
ABDOMEN - 1 VIEW

[t abdomen supine]
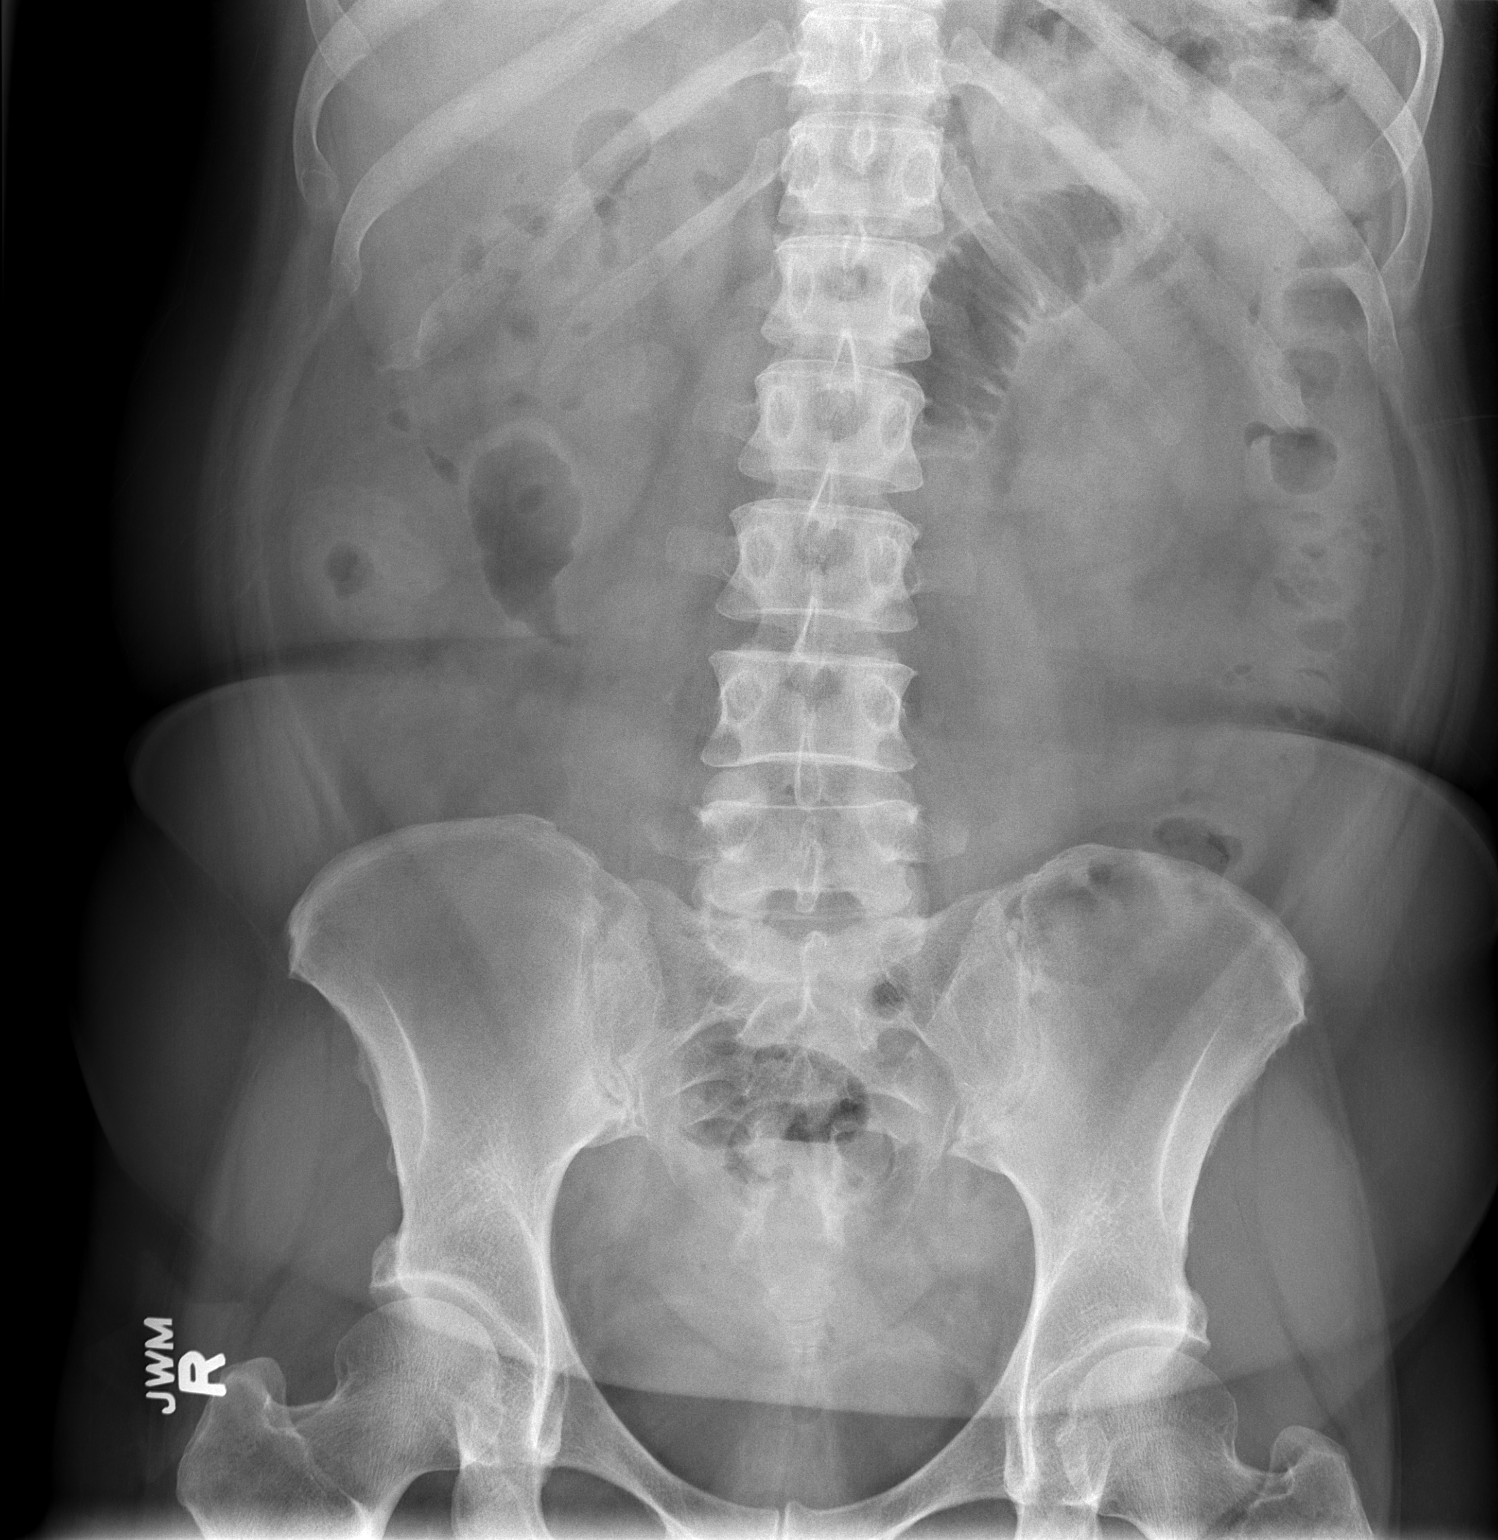

[1 of 1 positions shown; findings below may reference images not displayed]

FINDINGS: A single loop of nondistended small bowel terminates in a pointed
appearance. Overall paucity of small bowel gas. No intra-abdominal
mass effect or pathologic calcifications. Soft tissue planes and
included osseous structures are nonsuspicious.
IMPRESSION: Gas-filled nondistended small bowel terminating and pointed
appearance though, this is likely benign, though, if abdominal pain
persists, this could reflect early small bowel obstruction.

  By: Tenika Seth
